# Patient Record
Sex: Male | Born: 1970 | Race: White | Hispanic: No | Marital: Single | Smoking: Current every day smoker
Health system: Southern US, Community
[De-identification: ages and names within clinical notes are randomized; demographics above are authoritative.]

---

## 2008-09-27 ENCOUNTER — Emergency Department (HOSPITAL_COMMUNITY): Admission: EM | Admit: 2008-09-27 | Discharge: 2008-09-27 | Payer: Self-pay | Admitting: Emergency Medicine

## 2013-04-12 ENCOUNTER — Emergency Department (HOSPITAL_COMMUNITY)
Admission: EM | Admit: 2013-04-12 | Discharge: 2013-04-12 | Disposition: A | Discharge status: Home / Self Care | Payer: Self-pay | Attending: Emergency Medicine | Admitting: Emergency Medicine

## 2013-04-12 ENCOUNTER — Encounter (HOSPITAL_COMMUNITY): Payer: Self-pay | Admitting: Emergency Medicine

## 2013-04-12 ENCOUNTER — Emergency Department (HOSPITAL_COMMUNITY): Payer: Self-pay

## 2013-04-12 DIAGNOSIS — J40 Bronchitis, not specified as acute or chronic: Secondary | ICD-10-CM | POA: Insufficient documentation

## 2013-04-12 DIAGNOSIS — Z79899 Other long term (current) drug therapy: Secondary | ICD-10-CM | POA: Insufficient documentation

## 2013-04-12 DIAGNOSIS — Y9389 Activity, other specified: Secondary | ICD-10-CM | POA: Insufficient documentation

## 2013-04-12 DIAGNOSIS — S61209A Unspecified open wound of unspecified finger without damage to nail, initial encounter: Secondary | ICD-10-CM | POA: Insufficient documentation

## 2013-04-12 DIAGNOSIS — F17200 Nicotine dependence, unspecified, uncomplicated: Secondary | ICD-10-CM | POA: Insufficient documentation

## 2013-04-12 DIAGNOSIS — Z792 Long term (current) use of antibiotics: Secondary | ICD-10-CM | POA: Insufficient documentation

## 2013-04-12 DIAGNOSIS — W268XXA Contact with other sharp object(s), not elsewhere classified, initial encounter: Secondary | ICD-10-CM | POA: Insufficient documentation

## 2013-04-12 DIAGNOSIS — IMO0002 Reserved for concepts with insufficient information to code with codable children: Secondary | ICD-10-CM | POA: Insufficient documentation

## 2013-04-12 DIAGNOSIS — Y929 Unspecified place or not applicable: Secondary | ICD-10-CM | POA: Insufficient documentation

## 2013-04-12 MED ORDER — OXYCODONE-ACETAMINOPHEN 5-325 MG PO TABS
1.0000 | ORAL_TABLET | Freq: Once | ORAL | Status: AC
  Administered 2013-04-12: 1 via ORAL
  Filled 2013-04-12: qty 1

## 2013-04-12 MED ORDER — ALBUTEROL SULFATE HFA 108 (90 BASE) MCG/ACT IN AERS
2.0000 | INHALATION_SPRAY | RESPIRATORY_TRACT | Status: DC | PRN
  Administered 2013-04-12: 2 via RESPIRATORY_TRACT
  Filled 2013-04-12: qty 6.7

## 2013-04-12 MED ORDER — NAPROXEN 500 MG PO TABS: 500.0000 mg | ORAL_TABLET | Freq: Two times a day (BID) | ORAL | Status: AC

## 2013-04-12 MED ORDER — PREDNISONE 20 MG PO TABS: 40.0000 mg | ORAL_TABLET | Freq: Every day | ORAL | Status: AC

## 2013-04-12 MED ORDER — AMOXICILLIN 500 MG PO CAPS: 500.0000 mg | ORAL_CAPSULE | Freq: Three times a day (TID) | ORAL | Status: AC

## 2013-04-12 MED ORDER — AMOXICILLIN 500 MG PO CAPS
500.0000 mg | ORAL_CAPSULE | Freq: Once | ORAL | Status: AC
  Administered 2013-04-12: 500 mg via ORAL
  Filled 2013-04-12: qty 1

## 2013-04-12 MED ORDER — OXYCODONE-ACETAMINOPHEN 5-325 MG PO TABS: 1.0000 | ORAL_TABLET | Freq: Four times a day (QID) | ORAL | Status: AC | PRN

## 2013-04-12 NOTE — ED Notes (Signed)
Pain in l/hand-second finger tip, through nail bed. Laceration due to table saw. Bleeding controlled by pressure bandage from home

## 2013-04-12 NOTE — ED Provider Notes (Signed)
Medical screening examination/treatment/procedure(s) were performed by non-physician practitioner and as supervising physician I was immediately available for consultation/collaboration.  Jareli Highland T Adryel Wortmann, MD 04/12/13 2329 

## 2013-04-12 NOTE — ED Provider Notes (Signed)
CSN: [id number]     Arrival date & time 04/12/13  1416 History   First MD Initiated Contact with Patient 04/12/13 1434     Chief Complaint  Patient presents with  . Finger Injury    Laceration to tip of 2 nd finger l/hand index finger, pressure bandage applied at home. Pt was using table saw   (Consider location/radiation/quality/duration/timing/severity/associated sxs/prior Treatment) HPI Comments: Patient presents with complaint of fingertip laceration. Just prior to arrival, patient was using a table saw when he cut the tip of the left second digit into the nailbed. Patient applied gauze and tape at home. No other treatments. Tetanus less than 2 years ago. No numbness, tingling, weakness of his finger. He denies other injury. Onset of symptoms acute. Course is constant. Nothing makes symptoms better or worse.  Patient also complains of 3 days of cough and chest tightness. Patient states that he is a smoker and has a history of bronchitis. He denies fever, upper respiratory tract infection symptoms, chest pain. No nausea or vomiting. No treatments prior to arrival for this.  The history is provided by the patient.    History reviewed. No pertinent past medical history. History reviewed. No pertinent past surgical history. History reviewed. No pertinent family history. History  Substance Use Topics  . Smoking status: Current Every Day Smoker    Types: Cigarettes  . Smokeless tobacco: Not on file  . Alcohol Use: Yes    Review of Systems  Constitutional: Negative for fever.  HENT: Negative for sore throat and rhinorrhea.   Eyes: Negative for redness.  Respiratory: Positive for cough and wheezing. Negative for shortness of breath.   Cardiovascular: Negative for chest pain.  Gastrointestinal: Negative for nausea, vomiting, abdominal pain and diarrhea.  Genitourinary: Negative for dysuria.  Musculoskeletal: Negative for myalgias.  Skin: Positive for wound. Negative for rash.   Neurological: Negative for headaches.    Allergies  Review of patient's allergies indicates no known allergies.  Home Medications   Current Outpatient Rx  Name  Route  Sig  Dispense  Refill  . fluticasone (FLONASE) 50 MCG/ACT nasal spray   Nasal   Place 2 sprays into the nose daily as needed for rhinitis.         Marland Kitchen PENICILLIN V POTASSIUM PO   Oral   Take 1 tablet by mouth once.         Marland Kitchen amoxicillin (AMOXIL) 500 MG capsule   Oral   Take 1 capsule (500 mg total) by mouth 3 (three) times daily.   21 capsule   0   . naproxen (NAPROSYN) 500 MG tablet   Oral   Take 1 tablet (500 mg total) by mouth 2 (two) times daily.   20 tablet   0   . oxyCODONE-acetaminophen (PERCOCET/ROXICET) 5-325 MG per tablet   Oral   Take 1-2 tablets by mouth every 6 (six) hours as needed for pain.   10 tablet   0   . predniSONE (DELTASONE) 20 MG tablet   Oral   Take 2 tablets (40 mg total) by mouth daily.   10 tablet   0    BP 130/90  Pulse 92  Temp(Src) 98.1 F (36.7 C) (Oral)  Resp 18  Wt 160 lb (72.576 kg)  SpO2 100% Physical Exam  Nursing note and vitals reviewed. Constitutional: He appears well-developed and well-nourished.  HENT:  Head: Normocephalic and atraumatic.  Eyes: Conjunctivae are normal. Right eye exhibits no discharge. Left eye exhibits no discharge.  Neck: Normal range of motion. Neck supple.  Cardiovascular: Normal rate, regular rhythm and normal heart sounds.   Pulmonary/Chest: Effort normal. He has wheezes (mild, expiratory). He has rhonchi (Scattered).  Abdominal: Soft. There is no tenderness.  Neurological: He is alert.  Skin: Skin is warm and dry.  Patients with a 1 cm avulsion of the radial aspect of the left second digit involving the radial aspect of the nail. Laceration does not extend to matrix. Skin is severely abraded. Hemostatic. Appears clean, no obvious foreign body during exploration. Does not appear to be deep.  Psychiatric: He has a normal  mood and affect.    ED Course  Procedures (including critical care time) Labs Review Labs Reviewed - No data to display Imaging Review Dg Chest 2 View  04/12/2013   CLINICAL DATA:  Left index finger injury. Tobacco use.  EXAM: CHEST  2 VIEW  COMPARISON:  None.  FINDINGS: Airway thickening is present, suggesting bronchitis or reactive airways disease. Cardiac and mediastinal margins appear normal. No pleural effusion identified.  IMPRESSION: 1. Airway thickening is present, suggesting bronchitis or reactive airways disease.   Electronically Signed   By: Herbie Baltimore M.D.   On: 04/12/2013 15:25   Dg Hand Complete Left  04/12/2013   CLINICAL DATA:  Left index finger injury. Table saw.  EXAM: LEFT HAND - COMPLETE 3+ VIEW  COMPARISON:  None.  FINDINGS: Soft tissue defect in the tip of the index finger noted. Mild dorsal notching of the adjacent tuft.  Prominent ulnar styloid noted without findings of the ulnotriquetral abutment.  IMPRESSION: 1. Soft tissue defect in the left index finger tip. Dorsal notching of the tuft could represent a small focal bony cortical direct injury, but a well-defined fracture plane is not observed.   Electronically Signed   By: Herbie Baltimore M.D.   On: 04/12/2013 15:32   Patient seen and examined. Work-up initiated. Medications ordered.    Vital signs reviewed and are as follows: Filed Vitals:   04/12/13 1424  BP: 130/90  Pulse: 92  Temp: 98.1 F (36.7 C)  Resp: 18   Digital block performed Technique: ring block, 3 sided Finger: L 2nd digit Area prepped with alcohol wipe and iodine Medication: 6mL of 1% lidocaine without epinephrine Patient tolerated procedure well. Anesthesia of finger achieved except for tip in area of wound.  X-ray results reviewed. Wound explored. Wound cleaned and soaked with iodine, saline solution. No obvious foreign bodies. Minor debridement performed.   Splint by orthopedic tech. Bandage by nurse.  Patient counseled on  wound care. Patient was urged to return to the Emergency Department urgently with worsening pain, swelling, expanding erythema especially if it streaks away from the affected area, fever, or if they have any other concerns. Patient verbalized understanding.   Patient informed chest x-ray results. Will discharge to home with albuterol, prednisone.    MDM   1. Fingertip avulsion, initial encounter   2. Bronchitis    Fingertip avulsion: No significant tendon injury suspected. Patient has full range of motion. No foreign body suspected. X-ray does show potential bony involvement without fracture, however upon expiration the wound does not appear to be deep enough to extend to bone. Will give course of antibiotics given possible bony involvement. Involved area is too abraded to be amenable for suturing. We will need to heal by secondary intention. Feel that fingernail will likely heal as the matrix this does not involve. Orthopedic followup given if patient desires to see orthopedist.  Bronchitis: Symptoms for 3 days. Chest x-ray does not demonstrate pneumonia. Will treat conservatively with albuterol and steroids. Amoxicillin chosen as prophylactic treatment for finger lac and would treat any bacterial components of bronchitis in this patient who is a smoker. Vital signs appear normal. Patient appears well, nontoxic.    Renne Crigler, PA-C 04/12/13 [phone]

## 2015-01-21 IMAGING — CR DG HAND COMPLETE 3+V*L*
3 series · 3 of 3 positions shown · non-contrast
Comparison: None.

CLINICAL DATA: Left index finger injury. Table saw.

EXAM:
LEFT HAND - COMPLETE 3+ VIEW

[x hand pa left]
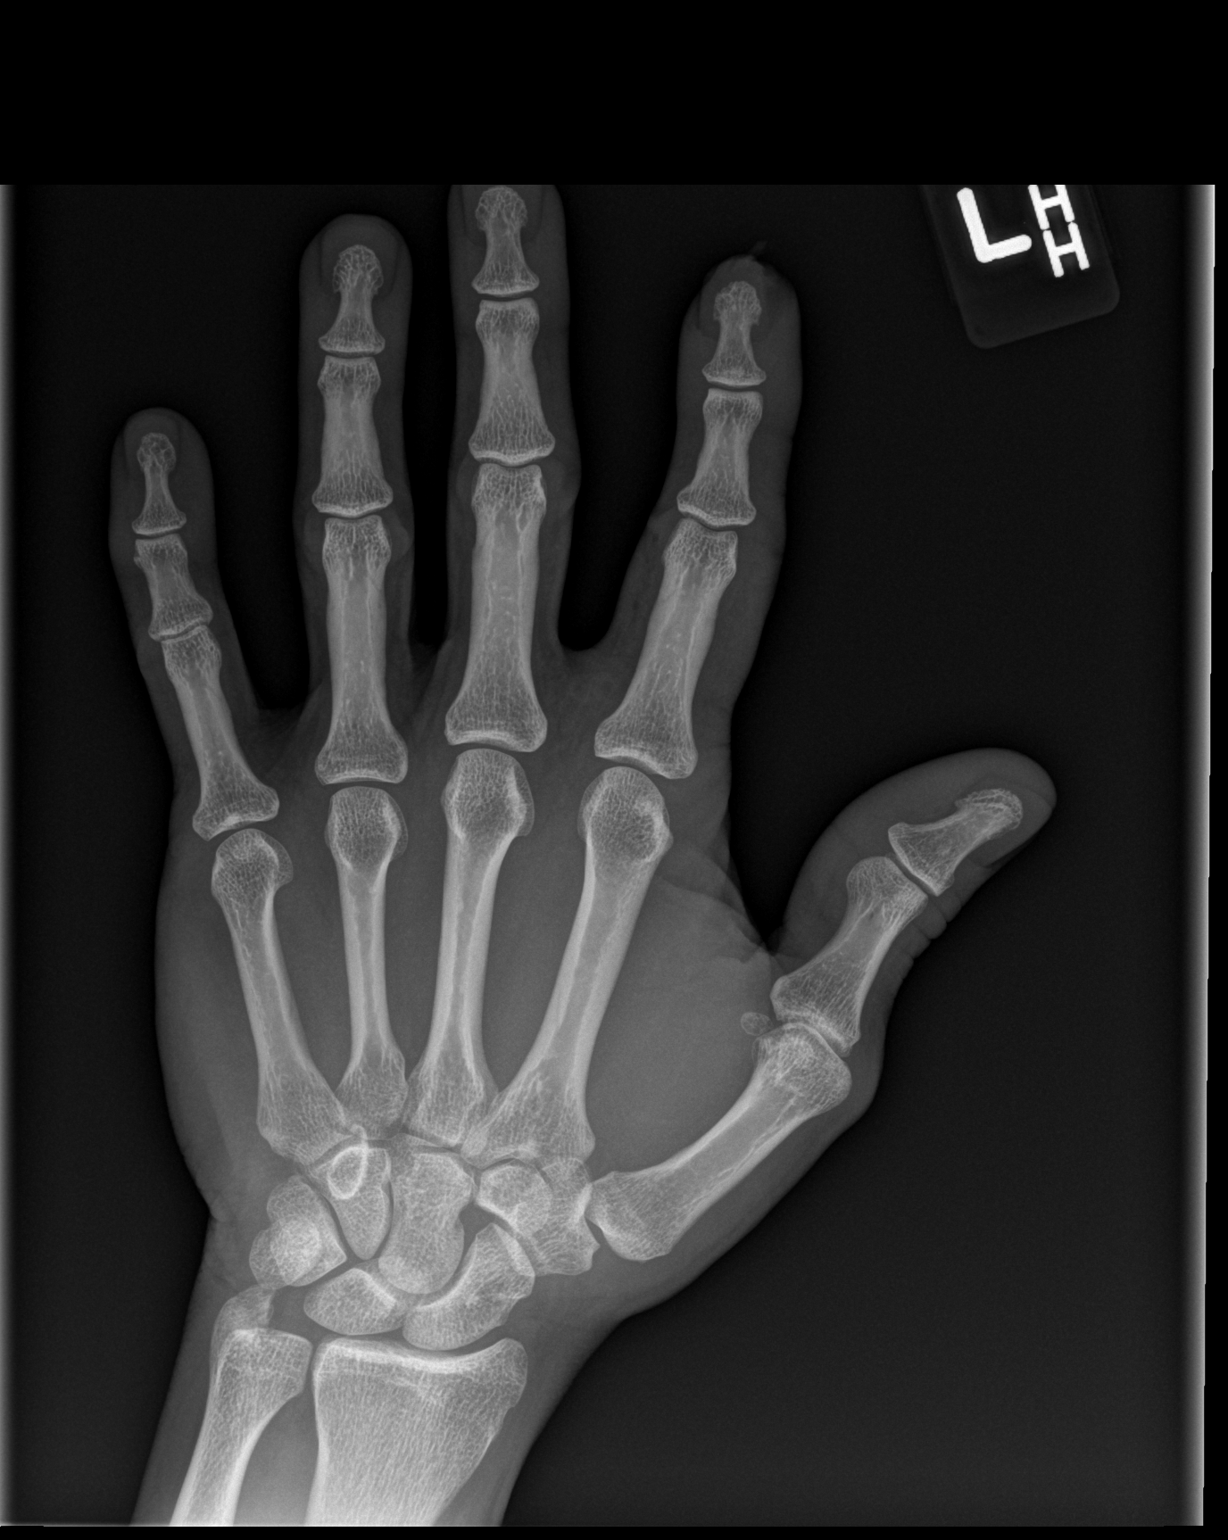

[x hand obl left]
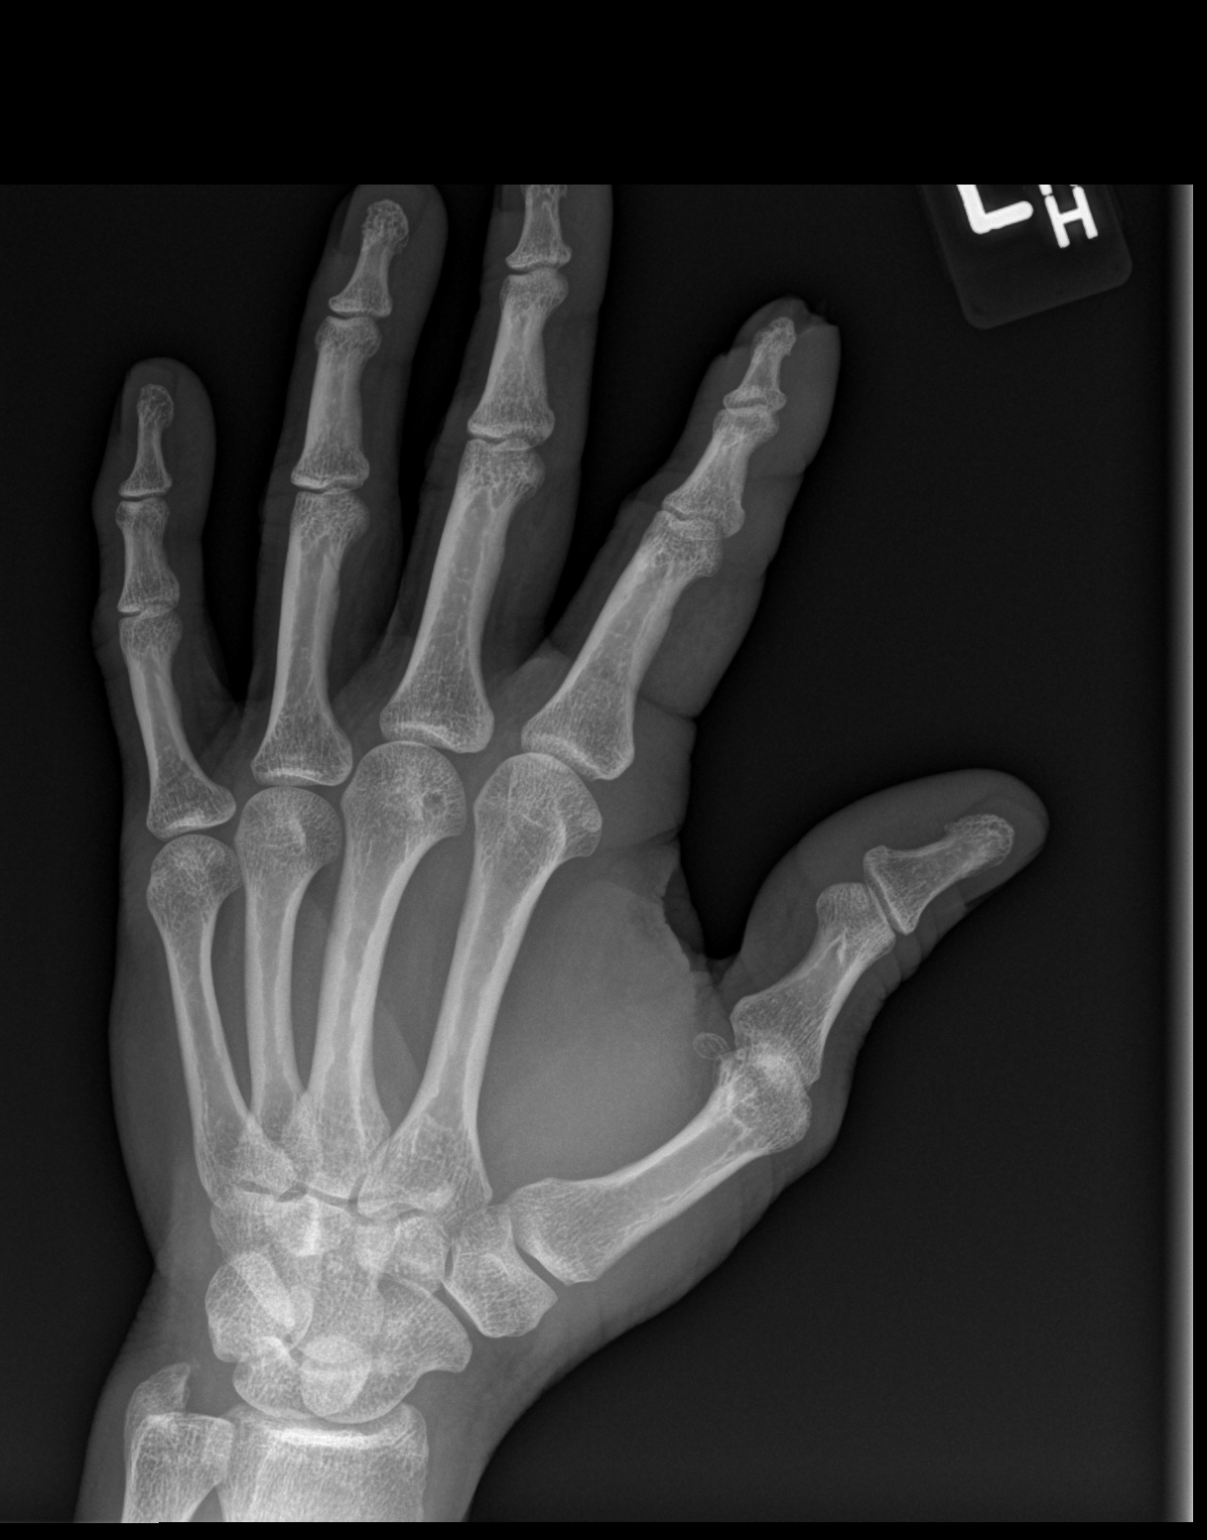

[x hand lat left]
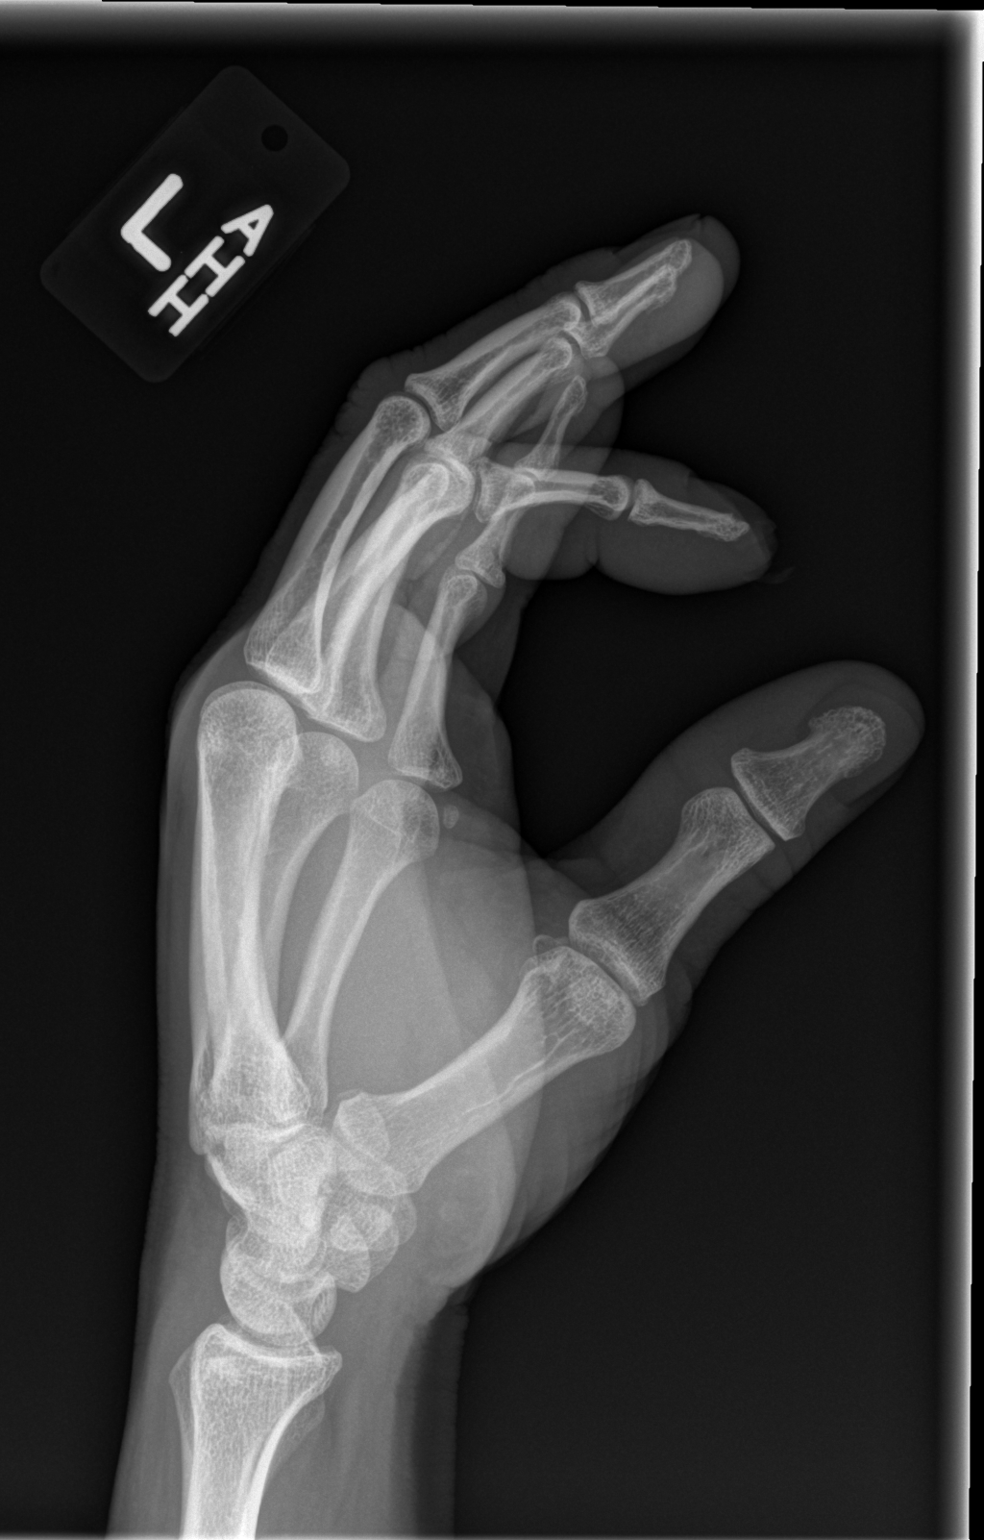

[3 of 3 positions shown; findings below may reference images not displayed]

FINDINGS: Soft tissue defect in the tip of the index finger noted. Mild dorsal
notching of the adjacent tuft.

Prominent ulnar styloid noted without findings of the ulnotriquetral
abutment.
IMPRESSION: 1. Soft tissue defect in the left index finger tip. Dorsal notching
of the tuft could represent a small focal bony cortical direct
injury, but a well-defined fracture plane is not observed.

## 2021-08-25 ENCOUNTER — Encounter (HOSPITAL_BASED_OUTPATIENT_CLINIC_OR_DEPARTMENT_OTHER): Payer: Self-pay | Admitting: Emergency Medicine

## 2021-08-25 ENCOUNTER — Emergency Department (HOSPITAL_BASED_OUTPATIENT_CLINIC_OR_DEPARTMENT_OTHER)
Admission: EM | Admit: 2021-08-25 | Discharge: 2021-08-25 | Disposition: A | Discharge status: Home / Self Care | Payer: Self-pay | Attending: Emergency Medicine | Admitting: Emergency Medicine

## 2021-08-25 ENCOUNTER — Other Ambulatory Visit: Payer: Self-pay

## 2021-08-25 DIAGNOSIS — L03115 Cellulitis of right lower limb: Secondary | ICD-10-CM | POA: Insufficient documentation

## 2021-08-25 DIAGNOSIS — L03119 Cellulitis of unspecified part of limb: Secondary | ICD-10-CM

## 2021-08-25 DIAGNOSIS — L03116 Cellulitis of left lower limb: Secondary | ICD-10-CM | POA: Insufficient documentation

## 2021-08-25 MED ORDER — DOXYCYCLINE HYCLATE 100 MG PO CAPS: 100.0000 mg | ORAL_CAPSULE | Freq: Two times a day (BID) | ORAL | 0 refills | Status: AC

## 2021-08-25 MED ORDER — DOXYCYCLINE HYCLATE 100 MG PO CAPS: 100.0000 mg | ORAL_CAPSULE | Freq: Two times a day (BID) | ORAL | 0 refills | Status: DC

## 2021-08-25 MED ORDER — DOXYCYCLINE HYCLATE 100 MG PO TABS
100.0000 mg | ORAL_TABLET | Freq: Once | ORAL | Status: AC
  Administered 2021-08-25: 100 mg via ORAL
  Filled 2021-08-25: qty 1

## 2021-08-25 NOTE — ED Provider Notes (Signed)
[facility] EMERGENCY DEPT Provider Note   CSN: [id number] Arrival date & time: 08/25/21  2045     History  Chief Complaint  Patient presents with   Leg Pain    Tony Kirk is a 51 y.o. male.  HPI     This a 51 year old male who presents with redness of the lower extremities.  Patient noted redness left greater than right of the lower extremity after popping a blister on the left lower leg.  He denies any significant systemic symptoms.  No fevers.  Patient has noted warmth and mild pain specifically to the left lower leg.  No history of the same.  Home Medications Prior to Admission medications   Medication Sig Start Date End Date Taking? Authorizing Provider  doxycycline (VIBRAMYCIN) 100 MG capsule Take 1 capsule (100 mg total) by mouth 2 (two) times daily. 08/25/21  Yes Khalin Royce, Mayer Masker, MD  amoxicillin (AMOXIL) 500 MG capsule Take 1 capsule (500 mg total) by mouth 3 (three) times daily. 04/12/13   Renne Crigler, PA-C  fluticasone (FLONASE) 50 MCG/ACT nasal spray Place 2 sprays into the nose daily as needed for rhinitis.    [provider]  naproxen (NAPROSYN) 500 MG tablet Take 1 tablet (500 mg total) by mouth 2 (two) times daily. 04/12/13   Renne Crigler, PA-C  oxyCODONE-acetaminophen (PERCOCET/ROXICET) 5-325 MG per tablet Take 1-2 tablets by mouth every 6 (six) hours as needed for pain. 04/12/13   Renne Crigler, PA-C  PENICILLIN V POTASSIUM PO Take 1 tablet by mouth once.    [provider]  predniSONE (DELTASONE) 20 MG tablet Take 2 tablets (40 mg total) by mouth daily. 04/12/13   Renne Crigler, PA-C      Allergies    Patient has no known allergies.    Review of Systems   Review of Systems  Constitutional:  Negative for fever.  Skin:  Positive for color change.  All other systems reviewed and are negative.  Physical Exam Updated Vital Signs BP 105/75    Pulse 77    Temp 98.3 F (36.8 C)    Resp 20    Ht 1.727 m (5\' 8" )    Wt  74.8 kg    SpO2 95%    BMI 25.09 kg/m  Physical Exam Vitals and nursing note reviewed.  Constitutional:      Appearance: He is well-developed. He is not ill-appearing.  HENT:     Head: Normocephalic and atraumatic.     Mouth/Throat:     Mouth: Mucous membranes are moist.  Eyes:     Pupils: Pupils are equal, round, and reactive to light.  Cardiovascular:     Rate and Rhythm: Normal rate and regular rhythm.     Heart sounds: Normal heart sounds.  Pulmonary:     Effort: Pulmonary effort is normal. No respiratory distress.     Breath sounds: Normal breath sounds. No wheezing.  Abdominal:     Palpations: Abdomen is soft.     Tenderness: There is no abdominal tenderness.  Musculoskeletal:     Comments: Warmth and erythema to the left lower extremity with scabbed area on the medial calf, no significant asymmetric swelling noted, mild erythema of the right lower extremity  Skin:    General: Skin is warm and dry.  Neurological:     Mental Status: He is alert and oriented to person, place, and time.  Psychiatric:        Mood and Affect: Mood normal.  ED Results / Procedures / Treatments   Labs (all labs ordered are listed, but only abnormal results are displayed) Labs Reviewed - No data to display  EKG None  Radiology No results found.  Procedures Procedures    Medications Ordered in ED Medications  doxycycline (VIBRA-TABS) tablet 100 mg (100 mg Oral Given 08/25/21 2327)    ED Course/ Medical Decision Making/ A&P                           Medical Decision Making Risk Prescription drug management.   This patient presents to the ED for concern of skin redness, this involves an extensive number of treatment options, and is a complaint that carries with it a high risk of complications and morbidity.  The differential diagnosis includes cellulitis, DVT  MDM:    This is a 51 year old male who presents with redness left greater than right of the lower extremities.  He  is nontoxic and vital signs are reassuring.  He is afebrile.  Exam is consistent with cellulitis.  He had a prior blister that he popped which is likely where the infection started.  He does have some mild erythema of the right lower extremity as well.  Skin is generally dry in that area.  He is not systemically ill.  Do not feel he needs lab work at this time.  We will empirically treat for cellulitis with doxycycline.  Have lower suspicion for DVT. (Labs, imaging)  Labs: I Ordered, and personally interpreted labs.  The pertinent results include: None  Imaging Studies ordered: I ordered imaging studies including none I independently visualized and interpreted imaging. I agree with the radiologist interpretation  Additional history obtained from N/A.  External records from outside source obtained and reviewed including N/A  Critical Interventions: Doxycycline  Consultations: I requested consultation with the NA,  and discussed lab and imaging findings as well as pertinent plan - they recommend: N/A  Cardiac Monitoring: The patient was maintained on a cardiac monitor.  I personally viewed and interpreted the cardiac monitored which showed an underlying rhythm of: Normal sinus rhythm  Reevaluation: After the interventions noted above, I reevaluated the patient and found that they have :improved   Considered admission for: N/A  Social Determinants of Health: Lives independently  Disposition: Discharge  Co morbidities that complicate the patient evaluation History reviewed. No pertinent past medical history.   Medicines Meds ordered this encounter  Medications   doxycycline (VIBRA-TABS) tablet 100 mg   DISCONTD: doxycycline (VIBRAMYCIN) 100 MG capsule    Sig: Take 1 capsule (100 mg total) by mouth 2 (two) times daily.    Dispense:  20 capsule    Refill:  0   doxycycline (VIBRAMYCIN) 100 MG capsule    Sig: Take 1 capsule (100 mg total) by mouth 2 (two) times daily.     Dispense:  20 capsule    Refill:  0    I have reviewed the patients home medicines and have made adjustments as needed  Problem List / ED Course: Problem List Items Addressed This Visit   None Visit Diagnoses     Cellulitis of lower extremity, unspecified laterality    -  Primary                   Final Clinical Impression(s) / ED Diagnoses Final diagnoses:  Cellulitis of lower extremity, unspecified laterality    Rx / DC Orders ED Discharge Orders  Ordered    doxycycline (VIBRAMYCIN) 100 MG capsule  2 times daily        08/25/21 2335              Shon Baton, MD 08/25/21 2341

## 2021-08-25 NOTE — Discharge Instructions (Signed)
You were seen today for redness of the lower leg.  This is likely related to infection.  Take medications as prescribed.  If you notice increasing redness or develop fevers, you should be reevaluated.

## 2021-08-25 NOTE — ED Triage Notes (Signed)
Pt c/o lower bilateral leg pain with redness x 1 day
